# Patient Record
Sex: Female | Born: 1970 | Race: White | Hispanic: No | Smoking: Former smoker
Health system: Southern US, Community
[De-identification: ages and names within clinical notes are randomized; demographics above are authoritative.]

## PROBLEM LIST (undated history)

## (undated) DIAGNOSIS — E785 Hyperlipidemia, unspecified: Secondary | ICD-10-CM

## (undated) DIAGNOSIS — I10 Essential (primary) hypertension: Secondary | ICD-10-CM

## (undated) HISTORY — PX: HERNIA REPAIR: SHX51

---

## 2011-09-24 ENCOUNTER — Emergency Department (INDEPENDENT_AMBULATORY_CARE_PROVIDER_SITE_OTHER): Payer: BC Managed Care – PPO

## 2011-09-24 ENCOUNTER — Encounter: Payer: Self-pay | Admitting: *Deleted

## 2011-09-24 ENCOUNTER — Emergency Department (HOSPITAL_BASED_OUTPATIENT_CLINIC_OR_DEPARTMENT_OTHER)
Admission: EM | Admit: 2011-09-24 | Discharge: 2011-09-24 | Disposition: A | Discharge status: Home / Self Care | Payer: BC Managed Care – PPO | Attending: Emergency Medicine | Admitting: Emergency Medicine

## 2011-09-24 ENCOUNTER — Other Ambulatory Visit: Payer: Self-pay

## 2011-09-24 DIAGNOSIS — J40 Bronchitis, not specified as acute or chronic: Secondary | ICD-10-CM

## 2011-09-24 DIAGNOSIS — E785 Hyperlipidemia, unspecified: Secondary | ICD-10-CM | POA: Insufficient documentation

## 2011-09-24 DIAGNOSIS — R5381 Other malaise: Secondary | ICD-10-CM

## 2011-09-24 DIAGNOSIS — R0602 Shortness of breath: Secondary | ICD-10-CM

## 2011-09-24 DIAGNOSIS — R5383 Other fatigue: Secondary | ICD-10-CM

## 2011-09-24 DIAGNOSIS — R05 Cough: Secondary | ICD-10-CM

## 2011-09-24 DIAGNOSIS — Z79899 Other long term (current) drug therapy: Secondary | ICD-10-CM | POA: Insufficient documentation

## 2011-09-24 DIAGNOSIS — J3489 Other specified disorders of nose and nasal sinuses: Secondary | ICD-10-CM | POA: Insufficient documentation

## 2011-09-24 HISTORY — DX: Hyperlipidemia, unspecified: E78.5

## 2011-09-24 LAB — DIFFERENTIAL
Basophils Relative: 0 % (ref 0–1)
Eosinophils Absolute: 0.2 10*3/uL (ref 0.0–0.7)
Eosinophils Relative: 1 % (ref 0–5)
Monocytes Absolute: 0.9 10*3/uL (ref 0.1–1.0)
Monocytes Relative: 7 % (ref 3–12)

## 2011-09-24 LAB — BASIC METABOLIC PANEL
BUN: 14 mg/dL (ref 6–23)
Calcium: 9.6 mg/dL (ref 8.4–10.5)
Creatinine, Ser: 0.8 mg/dL (ref 0.50–1.10)
GFR calc Af Amer: >90 mL/min (ref >90)
GFR calc non Af Amer: >90 mL/min (ref >90)

## 2011-09-24 LAB — CBC
HCT: 45.3 % (ref 36.0–46.0)
Hemoglobin: 15.5 g/dL — ABNORMAL HIGH (ref 12.0–15.0)
MCH: 32.2 pg (ref 26.0–34.0)
MCHC: 34.2 g/dL (ref 30.0–36.0)
RDW: 12.4 % (ref 11.5–15.5)

## 2011-09-24 MED ORDER — SODIUM CHLORIDE 0.9 % IV BOLUS (SEPSIS)
1000.0000 mL | Freq: Once | INTRAVENOUS | Status: AC
  Administered 2011-09-24: 1000 mL via INTRAVENOUS

## 2011-09-24 MED ORDER — ALBUTEROL SULFATE HFA 108 (90 BASE) MCG/ACT IN AERS: 1.0000 | INHALATION_SPRAY | Freq: Four times a day (QID) | RESPIRATORY_TRACT | Status: AC | PRN

## 2011-09-24 NOTE — ED Notes (Signed)
MD at bedside discussing results with pt.

## 2011-09-24 NOTE — ED Provider Notes (Signed)
History  This chart was scribed for Kelly Octave, MD by Kelly Preston. This patient was seen in room MHT13/M[facility]13 and the patient's care was started at 7:35PM.  CSN: [id number] Arrival date & time: 09/24/2011  6:48 PM   First MD Initiated Contact with Patient 09/24/11 1925      Chief Complaint  Patient presents with  . Cough  . Nasal Congestion    The history is provided by the patient. No language interpreter was used.   Kelly Preston is a 40 y.o. female who presents to the Emergency Department complaining of 6 days of gradual onset, non-changing chest congestion and cough with associated weakness in both of her legs, mild SOB, one episode of diarrhea last night and chest pain described as a discomfort feeling.  Pt reports that she was diagnosed 5 days ago by PCP with pneumonia. Pt reports that she just finished levaquin and z pk with no improvement in her symptoms. Pt denies fever, nausea and vomiting as associated symptoms. Pt states that she has a h/o early MI in the family.  Past Medical History  Diagnosis Date  . Hyperlipidemia     History reviewed. No pertinent past surgical history.  History reviewed. No pertinent family history.  History  Substance Use Topics  . Smoking status: Never Smoker   . Smokeless tobacco: Not on file  . Alcohol Use: No    OB History    Grav Para Term Preterm Abortions TAB SAB Ect Mult Living                  Review of Systems A complete 10 system review of systems was obtained and is otherwise negative except as noted in the HPI.   Allergies  Penicillins  Home Medications   Current Outpatient Rx  Name Route Sig Dispense Refill  . AZITHROMYCIN 250 MG PO TABS Oral Take 250-500 mg by mouth daily. Take 2 tabs on day 1 then take 1 tab for 4 days     . FLUOXETINE HCL 20 MG PO TABS Oral Take 20 mg by mouth daily.      Marland Kitchen ONE-DAILY MULTI VITAMINS PO TABS Oral Take 1 tablet by mouth daily.      Marland Kitchen PRESCRIPTION MEDICATION Injection Inject  1 each as directed once. Prednisone Injection on Thursday (09-20-11)     . SIMVASTATIN 20 MG PO TABS Oral Take 20 mg by mouth at bedtime.      . ALBUTEROL SULFATE HFA 108 (90 BASE) MCG/ACT IN AERS Inhalation Inhale 1-2 puffs into the lungs every 6 (six) hours as needed for wheezing. 1 Inhaler 0    Triage Vitals: BP 136/88  Pulse 73  Temp(Src) 98.2 F (36.8 C) (Oral)  Resp 16  Ht 5\' 5"  (1.651 m)  Wt 185 lb (83.915 kg)  BMI 30.79 kg/m2  SpO2 100%  LMP 08/29/2011  Physical Exam  Nursing note and vitals reviewed. Constitutional: She is oriented to person, place, and time. She appears well-developed and well-nourished.  HENT:  Head: Normocephalic and atraumatic.  Eyes: Conjunctivae and EOM are normal. Pupils are equal, round, and reactive to light.  Neck: Normal range of motion. Neck supple.  Cardiovascular: Normal rate, regular rhythm and normal heart sounds.   Pulmonary/Chest: Effort normal and breath sounds normal.  Abdominal: Soft. Bowel sounds are normal. There is no tenderness.  Musculoskeletal: Normal range of motion. She exhibits no edema.  Neurological: She is alert and oriented to person, place, and time.  Normal neurological exam, normal gait  Skin: Skin is warm and dry.    ED Course  Procedures (including critical care time)  DIAGNOSTIC STUDIES: Oxygen Saturation is 100% on room air, normal by my interpretation.    COORDINATION OF CARE: 7:41PM-Discussed treatment plan with pt at bedside and pt agreed to plan. 8:47PM-Discussed negative chest x-ray findings and pt acknowledged findings. Advised pt to see PCP in one week if still not feeling better. Advised pt to rest and stay hydrated. Pt is comfortable going home.   Labs Reviewed  CBC - Abnormal; Notable for the following:    WBC 13.6 (*)    Hemoglobin 15.5 (*)    All other components within normal limits  DIFFERENTIAL - Abnormal; Notable for the following:    Neutro Abs 8.8 (*)    All other components  within normal limits  BASIC METABOLIC PANEL  D-DIMER, QUANTITATIVE  CK   Dg Chest 2 View  09/24/2011  *RADIOLOGY REPORT*  Clinical Data: Cough with shortness of breath and weakness.  CHEST - 2 VIEW  Comparison: None.  Findings: The heart size and mediastinal contours are normal. The lungs are clear. There is no pleural effusion or pneumothorax. No acute osseous findings are identified.  IMPRESSION: No active cardiopulmonary process.  Original Report Authenticated By: Gerrianne Scale, M.D.     1. Bronchitis       MDM  Cough and congestion, recently treated with Zpack and prednisone.  Nonfocal exam with clear lungs and negative chest x-ray.  She endorses leg weakness for the question is is more generalized and not found objectively. His normal gait normal leg strength bilaterally.   Date: 09/24/2011  Rate: 69  Rhythm: normal sinus rhythm  QRS Axis: normal  Intervals: normal  ST/T Wave abnormalities: normal  Conduction Disutrbances:none  Narrative Interpretation:   Old EKG Reviewed: unchanged  CXR neg.  Normal gait.  Lungs clear.  Continue Zpak and albuterol.  I personally performed the services described in this documentation, which was scribed in my presence.  The recorded information has been reviewed and considered.     Kelly Octave, MD 09/24/11 [phone]

## 2011-09-24 NOTE — ED Notes (Signed)
Pt c/o chest congestion and cough dx x 5 days ago by PMD with xray pneumonia .  Just finished levaquin and z pk

## 2021-03-29 ENCOUNTER — Emergency Department (HOSPITAL_BASED_OUTPATIENT_CLINIC_OR_DEPARTMENT_OTHER): Payer: BLUE CROSS/BLUE SHIELD

## 2021-03-29 ENCOUNTER — Other Ambulatory Visit: Payer: Self-pay

## 2021-03-29 ENCOUNTER — Encounter (HOSPITAL_BASED_OUTPATIENT_CLINIC_OR_DEPARTMENT_OTHER): Payer: Self-pay

## 2021-03-29 ENCOUNTER — Emergency Department (HOSPITAL_BASED_OUTPATIENT_CLINIC_OR_DEPARTMENT_OTHER)
Admission: EM | Admit: 2021-03-29 | Discharge: 2021-03-29 | Disposition: A | Discharge status: Home / Self Care | Payer: BLUE CROSS/BLUE SHIELD | Attending: Emergency Medicine | Admitting: Emergency Medicine

## 2021-03-29 DIAGNOSIS — R001 Bradycardia, unspecified: Secondary | ICD-10-CM | POA: Diagnosis not present

## 2021-03-29 DIAGNOSIS — U071 COVID-19: Secondary | ICD-10-CM | POA: Insufficient documentation

## 2021-03-29 DIAGNOSIS — Z87891 Personal history of nicotine dependence: Secondary | ICD-10-CM | POA: Diagnosis not present

## 2021-03-29 DIAGNOSIS — I10 Essential (primary) hypertension: Secondary | ICD-10-CM | POA: Insufficient documentation

## 2021-03-29 DIAGNOSIS — Z79899 Other long term (current) drug therapy: Secondary | ICD-10-CM | POA: Insufficient documentation

## 2021-03-29 DIAGNOSIS — R0789 Other chest pain: Secondary | ICD-10-CM | POA: Diagnosis present

## 2021-03-29 HISTORY — DX: Essential (primary) hypertension: I10

## 2021-03-29 LAB — BASIC METABOLIC PANEL
Anion gap: 5 (ref 5–15)
BUN: 12 mg/dL (ref 6–20)
CO2: 28 mmol/L (ref 22–32)
Calcium: 8.3 mg/dL — ABNORMAL LOW (ref 8.9–10.3)
Chloride: 102 mmol/L (ref 98–111)
Creatinine, Ser: 1.05 mg/dL — ABNORMAL HIGH (ref 0.44–1.00)
GFR, Estimated: >60 mL/min (ref >60)
Glucose, Bld: 89 mg/dL (ref 70–99)
Potassium: 4.4 mmol/L (ref 3.5–5.1)
Sodium: 135 mmol/L (ref 135–145)

## 2021-03-29 LAB — CBC WITH DIFFERENTIAL/PLATELET
Abs Immature Granulocytes: 0.02 10*3/uL (ref 0.00–0.07)
Basophils Absolute: 0 10*3/uL (ref 0.0–0.1)
Basophils Relative: 1 %
Eosinophils Absolute: 0.2 10*3/uL (ref 0.0–0.5)
Eosinophils Relative: 3 %
HCT: 42.7 % (ref 36.0–46.0)
Hemoglobin: 14.1 g/dL (ref 12.0–15.0)
Immature Granulocytes: 0 %
Lymphocytes Relative: 29 %
Lymphs Abs: 1.8 10*3/uL (ref 0.7–4.0)
MCH: 32.1 pg (ref 26.0–34.0)
MCHC: 33 g/dL (ref 30.0–36.0)
MCV: 97.3 fL (ref 80.0–100.0)
Monocytes Absolute: 0.8 10*3/uL (ref 0.1–1.0)
Monocytes Relative: 13 %
Neutro Abs: 3.2 10*3/uL (ref 1.7–7.7)
Neutrophils Relative %: 54 %
Platelets: 228 10*3/uL (ref 150–400)
RBC: 4.39 MIL/uL (ref 3.87–5.11)
RDW: 12.9 % (ref 11.5–15.5)
WBC: 6 10*3/uL (ref 4.0–10.5)
nRBC: 0 % (ref 0.0–0.2)

## 2021-03-29 LAB — TROPONIN I (HIGH SENSITIVITY): Troponin I (High Sensitivity): <2 ng/L (ref <18)

## 2021-03-29 NOTE — ED Provider Notes (Signed)
[facility] EMERGENCY DEPARTMENT Provider Note   CSN: [id number] Arrival date & time: 03/29/21  1735     History Chief Complaint  Patient presents with  . Covid Positive   Kelly Preston is a 50 y.o. female with hx of HTN, HLD, former smoker (quit 30 years ago) who presents with chest pressure with onset this morning. She is COVID positive. She was on vacation in the [place] and flew back Sunday, started having fevers Monday, then tested positive for COVID Tuesday (yesterday). Reports associated non-productive cough, nausea, congestion, post-nasal drip, mild shortness of breath, sore throat. She felt lightheaded after standing up earlier today. Her chest pressure is not exertional. It is constant. Does not seem temporally associated with the nausea or shortness of breath. No personal hx of heart disease, but notes her father had a heart attack at age 66.    Past Medical History:  Diagnosis Date  . Hyperlipidemia   . Hypertension     There are no problems to display for this patient.   Past Surgical History:  Procedure Laterality Date  . HERNIA REPAIR       OB History   No obstetric history on file.     No family history on file.  Social History   Tobacco Use  . Smoking status: Former Games developer  . Smokeless tobacco: Never Used  Substance Use Topics  . Alcohol use: Yes    Comment: occ  . Drug use: Never    Home Medications Prior to Admission medications   Medication Sig Start Date End Date Taking? Authorizing Provider  albuterol (PROVENTIL HFA;VENTOLIN HFA) 108 (90 BASE) MCG/ACT inhaler Inhale 1-2 puffs into the lungs every 6 (six) hours as needed for wheezing. 09/24/11 09/23/12  Rancour, Jeannett Senior, MD  FLUoxetine (PROZAC) 20 MG tablet Take 20 mg by mouth daily.      [provider]  Multiple Vitamin (MULTIVITAMIN) tablet Take 1 tablet by mouth daily.      [provider]  PRESCRIPTION MEDICATION Inject 1 each as directed once.  Prednisone Injection on Thursday (09-20-11)     [provider]  simvastatin (ZOCOR) 20 MG tablet Take 20 mg by mouth at bedtime.      [provider]    Allergies    Penicillins  Review of Systems   Review of Systems  Constitutional: Positive for diaphoresis and fever. Negative for chills.  HENT: Positive for congestion, postnasal drip, sore throat and voice change.   Respiratory: Positive for cough and shortness of breath.   Cardiovascular: Positive for chest pain. Negative for palpitations and leg swelling.  Gastrointestinal: Positive for nausea. Negative for vomiting.  Neurological: Positive for dizziness. Negative for syncope.  All other systems reviewed and are negative.   Physical Exam Updated Vital Signs BP (!) 171/103 (BP Location: Left Arm)   Pulse (!) 52   Temp 99 F (37.2 C) (Oral)   Resp 20   Ht 5\' 5"  (1.651 m)   Wt 89.8 kg   SpO2 99%   BMI 32.95 kg/m   Physical Exam Vitals and nursing note reviewed.  Constitutional:      General: She is not in acute distress.    Appearance: Normal appearance. She is not ill-appearing.  HENT:     Head: Normocephalic and atraumatic.     Right Ear: External ear normal.     Left Ear: External ear normal.     Nose: Nose normal.     Mouth/Throat:  Mouth: Mucous membranes are moist.  Eyes:     Extraocular Movements: Extraocular movements intact.  Cardiovascular:     Rate and Rhythm: Regular rhythm. Bradycardia present.     Heart sounds: No murmur heard. No friction rub. No gallop.   Pulmonary:     Effort: Pulmonary effort is normal. No respiratory distress.     Breath sounds: No wheezing, rhonchi or rales.  Abdominal:     General: Abdomen is flat.  Musculoskeletal:        General: No swelling or deformity. Normal range of motion.     Cervical back: Normal range of motion and neck supple.  Skin:    General: Skin is warm and dry.     Comments: Peeling and erythematous skin on back consistent with  sunburn  Neurological:     General: No focal deficit present.     Mental Status: She is alert and oriented to person, place, and time.  Psychiatric:        Mood and Affect: Mood normal.        Behavior: Behavior normal.     ED Results / Procedures / Treatments   Labs (all labs ordered are listed, but only abnormal results are displayed) Labs Reviewed - No data to display  EKG None  Radiology No results found.  Procedures Procedures   Medications Ordered in ED Medications - No data to display  ED Course  I have reviewed the triage vital signs and the nursing notes.  Pertinent labs & imaging results that were available during my care of the patient were reviewed by me and considered in my medical decision making (see chart for details).  Clinical Course as of 03/29/21 2004  Wed Mar 29, 2021  1920 Pulse Rate(!): 52 [JC]  1920 Pulse Rate(!): 58 [JC]    Clinical Course User Index [JC] Remo Lipps, MD   MDM Rules/Calculators/A&P                          Patient with typical COVID-19 infection symptoms for past 2 days which are improving, started having chest pressure this morning around 0600. Chest pain is atypical. No personal hx of heart disease but her father had a MI at 30 years old. Checking troponin and CXR. Low suspicion for PE with lack of hypoxia, tachycardia, leg swelling/pain, hemoptysis, pleuritic pain.   Noted to be bradycardic. EKG demonstrates sinus arrhythmia. No heart block. Upon chart review, her pulse is typically 50-60, so this seems to not be a change from her usual.   Negative troponin, BMP and CBC unremarkable. At this time, have ruled out ACS and low suspicion for VTE as above. Discharging with instructions for supportive measures and return precautions.  Final Clinical Impression(s) / ED Diagnoses Final diagnoses:  COVID-19 virus infection     Remo Lipps, MD 03/29/21 2006    Melene Plan, DO 03/29/21 2143

## 2021-03-29 NOTE — Discharge Instructions (Addendum)
Ms. Rosano, it was a pleasure taking care of you. Your vitals signs, EKG, and lab work here have been reassuring. There is no evidence of heart attack. Based on the above, it is also unlikely that you have a blood clot.  Please continue getting rest and staying hydrated. I expect that you will feel better soon. You may try ibuprofen for the pain, 800mg  up to 3 times daily.

## 2021-03-29 NOTE — ED Triage Notes (Signed)
Pt states she tested +covid yesterday-c/o "chest pressure-I keep taking a deep breath"-NAD-steady gait

## 2022-02-08 IMAGING — DX DG CHEST 1V PORT
1 series · 1 of 1 positions shown · non-contrast
Comparison: September 24, 2011

CLINICAL DATA: Chest pain.

EXAM:
PORTABLE CHEST 1 VIEW

[chest ap]
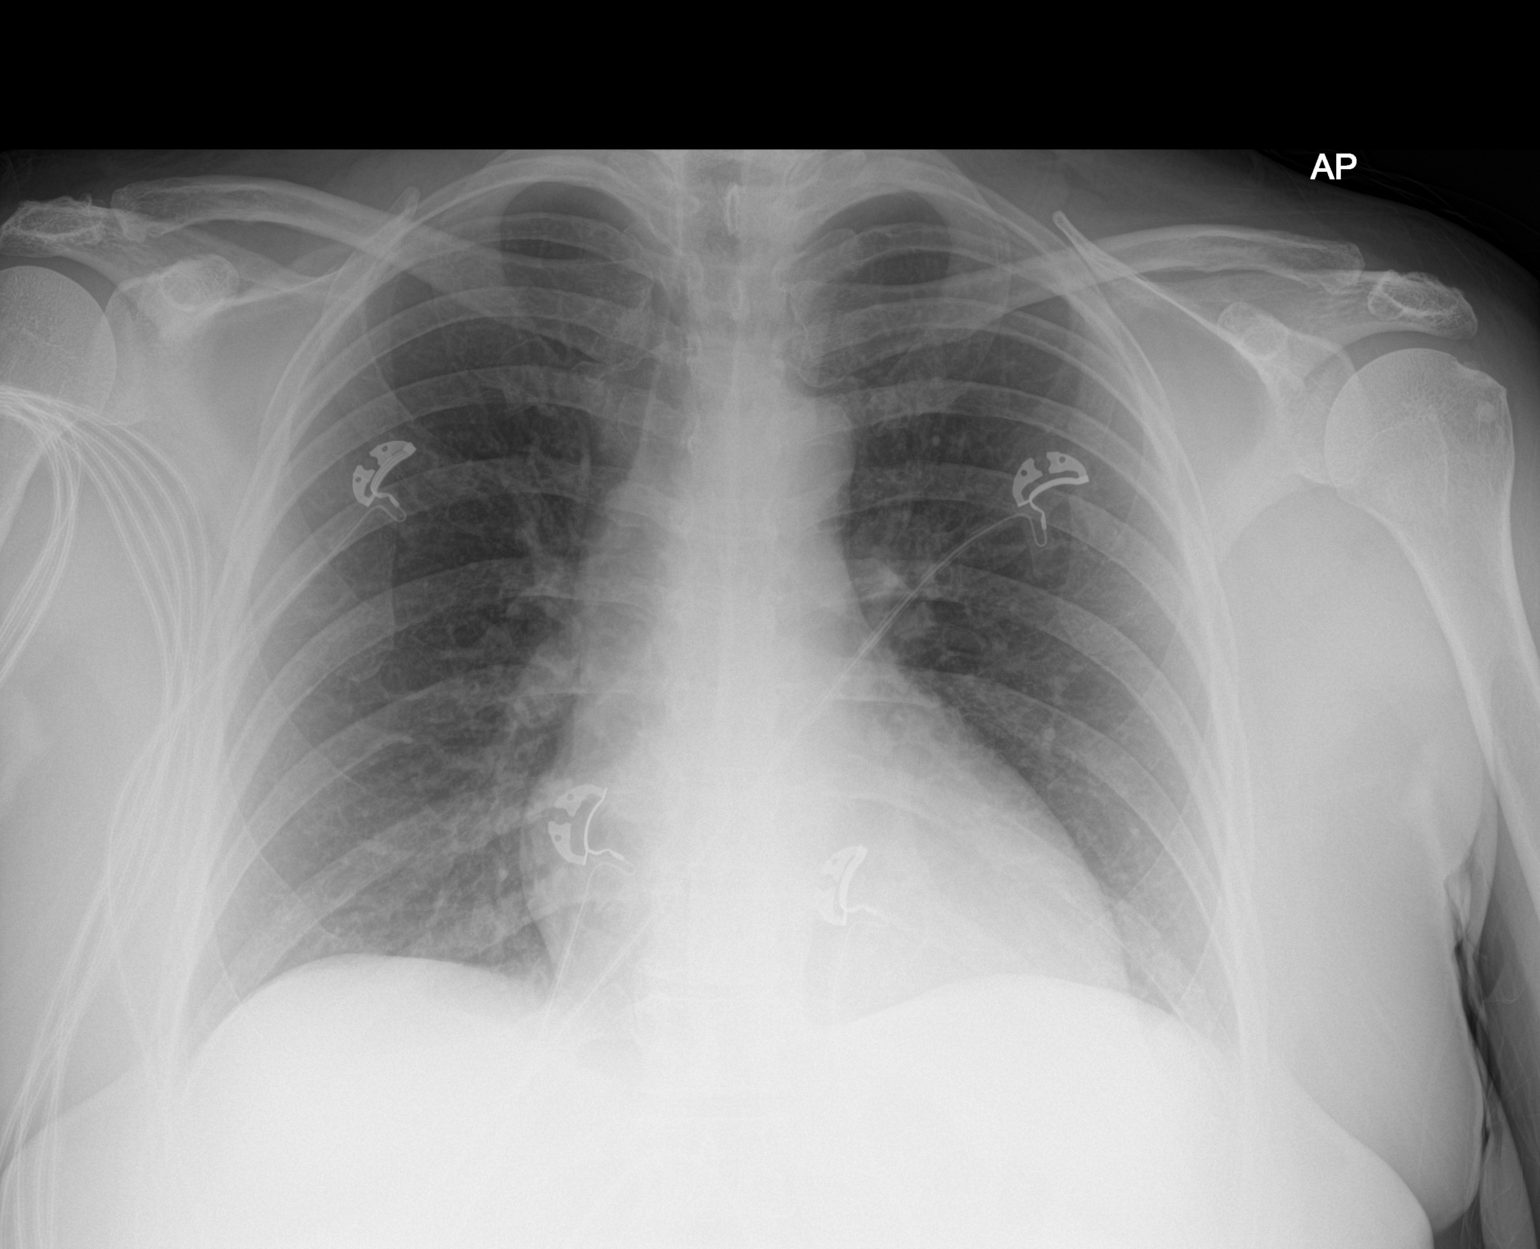

[1 of 1 positions shown; findings below may reference images not displayed]

FINDINGS: The heart size and mediastinal contours are within normal limits.
Both lungs are clear. The benign-appearing sclerotic focus is seen
within the left humeral head. The visualized skeletal structures are
unremarkable.
IMPRESSION: No active disease.
